# Patient Record
Sex: Male | Born: 1985 | Race: White | Hispanic: No | Marital: Single | Smoking: Current every day smoker
Health system: Southern US, Community
[De-identification: ages and names within clinical notes are randomized; demographics above are authoritative.]

## PROBLEM LIST (undated history)

## (undated) DIAGNOSIS — K8020 Calculus of gallbladder without cholecystitis without obstruction: Secondary | ICD-10-CM

## (undated) DIAGNOSIS — H209 Unspecified iridocyclitis: Secondary | ICD-10-CM

## (undated) DIAGNOSIS — R51 Headache: Secondary | ICD-10-CM

## (undated) DIAGNOSIS — G40909 Epilepsy, unspecified, not intractable, without status epilepticus: Secondary | ICD-10-CM

## (undated) DIAGNOSIS — M300 Polyarteritis nodosa: Secondary | ICD-10-CM

## (undated) DIAGNOSIS — R519 Headache, unspecified: Secondary | ICD-10-CM

## (undated) HISTORY — DX: Epilepsy, unspecified, not intractable, without status epilepticus: G40.909

## (undated) HISTORY — DX: Polyarteritis nodosa: M30.0

## (undated) HISTORY — PX: KNEE SURGERY: SHX244

## (undated) HISTORY — PX: OTHER SURGICAL HISTORY: SHX169

## (undated) HISTORY — DX: Unspecified iridocyclitis: H20.9

## (undated) HISTORY — PX: BIOPSY EYE MUSCLE: PRO14

---
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
## 2003-03-30 ENCOUNTER — Encounter: Payer: Self-pay | Admitting: Emergency Medicine

## 2003-03-30 ENCOUNTER — Emergency Department (HOSPITAL_COMMUNITY): Admission: EM | Admit: 2003-03-30 | Discharge: 2003-03-30 | Payer: Self-pay | Admitting: Emergency Medicine

## 2003-11-19 ENCOUNTER — Emergency Department (HOSPITAL_COMMUNITY): Admission: EM | Admit: 2003-11-19 | Discharge: 2003-11-19 | Payer: Self-pay | Admitting: Emergency Medicine

## 2013-09-16 ENCOUNTER — Ambulatory Visit: Payer: Self-pay | Admitting: Family Medicine

## 2013-09-25 ENCOUNTER — Encounter: Payer: Self-pay | Admitting: Family Medicine

## 2013-09-25 ENCOUNTER — Ambulatory Visit (INDEPENDENT_AMBULATORY_CARE_PROVIDER_SITE_OTHER): Payer: Medicare Other | Admitting: Family Medicine

## 2013-09-25 VITALS — BP 128/72 | HR 78 | Temp 98.5°F | Resp 18 | Ht 75.0 in | Wt 326.0 lb

## 2013-09-25 DIAGNOSIS — M300 Polyarteritis nodosa: Secondary | ICD-10-CM | POA: Insufficient documentation

## 2013-09-25 DIAGNOSIS — H209 Unspecified iridocyclitis: Secondary | ICD-10-CM | POA: Insufficient documentation

## 2013-09-25 DIAGNOSIS — G40909 Epilepsy, unspecified, not intractable, without status epilepticus: Secondary | ICD-10-CM | POA: Insufficient documentation

## 2013-09-25 DIAGNOSIS — J209 Acute bronchitis, unspecified: Secondary | ICD-10-CM

## 2013-09-25 MED ORDER — HYDROCODONE-HOMATROPINE 5-1.5 MG/5ML PO SYRP: 5.0000 mL | ORAL_SOLUTION | Freq: Three times a day (TID) | ORAL | Status: DC | PRN

## 2013-09-25 MED ORDER — AZITHROMYCIN 250 MG PO TABS: ORAL_TABLET | ORAL | Status: DC

## 2013-09-25 NOTE — Progress Notes (Signed)
   Subjective:    Patient ID: Lisbeth Renshaw, male    DOB: 1986/05/14, 27 y.o.   MRN: [id number]  HPI Patient is a very pleasant 27 year old white male who has a history of autoimmune disorder. He is currently seen at [facility] and is receiving Cytoxan. The actual diagnosis is unclear although he is presumptively being treated for polyarteritis nodosa.  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 3 months ago he developed generalized tonic-clonic seizures. This is being worked up at [facility] as well and is on Topamax for seizure disorder. Approximately 3 weeks ago he developed a nonproductive cough. He reports subjective fevers and chills. He reports some mild shortness of breath and central pleurisy.  He denies any hemoptysis. Past Medical History  Diagnosis Date  . PAN (polyarteritis nodosa)   . Uveitis   . Seizure disorder    No current outpatient prescriptions on file prior to visit.   No current facility-administered medications on file prior to visit.   Allergies  Allergen Reactions  . Shellfish Allergy    History   Social History  . Marital Status: Single    Spouse Name: N/A    Number of Children: N/A  . Years of Education: N/A   Occupational History  . Not on file.   Social History Main Topics  . Smoking status: Current Every Day Smoker  . Smokeless tobacco: Not on file  . Alcohol Use: Yes     Comment: occasional  . Drug Use: No  . Sexual Activity: No   Other Topics Concern  . Not on file   Social History Narrative  . No narrative on file      Review of Systems  All other systems reviewed and are negative.       Objective:   Physical Exam  Constitutional: He appears well-developed and well-nourished.  HENT:  Right Ear: External ear normal.  Left Ear: External ear normal.  Nose: Nose normal.  Mouth/Throat: Oropharynx is clear and moist.  Eyes: Conjunctivae are normal. No scleral icterus.  Neck: Neck supple.  Cardiovascular: Normal rate, regular rhythm,  normal heart sounds and intact distal pulses.  Exam reveals no gallop and no friction rub.   No murmur heard. Pulmonary/Chest: Effort normal and breath sounds normal. No respiratory distress. He has no wheezes. He has no rales.  Abdominal: Soft. Bowel sounds are normal.  Lymphadenopathy:    He has no cervical adenopathy.          Assessment & Plan:  1. Acute bronchitis Begin Z-Pak. Recheck in 1 week if no better or sooner if worse. If symptoms persist I would proceed with chest x-ray. The lesion in his navel appears to be a small barrel that has cleared up on a time. It requires no further attention at present - azithromycin (ZITHROMAX) 250 MG tablet; 2 tabs poqday 1, 1 tab poqday 2-5  Dispense: 6 tablet; Refill: 0

## 2015-01-06 ENCOUNTER — Inpatient Hospital Stay (HOSPITAL_COMMUNITY)
Admission: EM | Admit: 2015-01-06 | Discharge: 2015-01-08 | DRG: 418 | Disposition: A | Discharge status: Home / Self Care | Payer: Medicare Other | Attending: General Surgery | Admitting: General Surgery

## 2015-01-06 ENCOUNTER — Other Ambulatory Visit (HOSPITAL_COMMUNITY): Payer: Self-pay

## 2015-01-06 ENCOUNTER — Encounter (HOSPITAL_COMMUNITY): Payer: Self-pay | Admitting: *Deleted

## 2015-01-06 ENCOUNTER — Emergency Department (HOSPITAL_COMMUNITY): Payer: Medicare Other

## 2015-01-06 DIAGNOSIS — G40909 Epilepsy, unspecified, not intractable, without status epilepticus: Secondary | ICD-10-CM | POA: Diagnosis present

## 2015-01-06 DIAGNOSIS — Z7952 Long term (current) use of systemic steroids: Secondary | ICD-10-CM

## 2015-01-06 DIAGNOSIS — Z419 Encounter for procedure for purposes other than remedying health state, unspecified: Secondary | ICD-10-CM

## 2015-01-06 DIAGNOSIS — F17210 Nicotine dependence, cigarettes, uncomplicated: Secondary | ICD-10-CM | POA: Diagnosis present

## 2015-01-06 DIAGNOSIS — T380X5A Adverse effect of glucocorticoids and synthetic analogues, initial encounter: Secondary | ICD-10-CM | POA: Diagnosis present

## 2015-01-06 DIAGNOSIS — R1013 Epigastric pain: Secondary | ICD-10-CM | POA: Diagnosis present

## 2015-01-06 DIAGNOSIS — Z9221 Personal history of antineoplastic chemotherapy: Secondary | ICD-10-CM | POA: Diagnosis not present

## 2015-01-06 DIAGNOSIS — Z91013 Allergy to seafood: Secondary | ICD-10-CM | POA: Diagnosis not present

## 2015-01-06 DIAGNOSIS — K8000 Calculus of gallbladder with acute cholecystitis without obstruction: Secondary | ICD-10-CM

## 2015-01-06 DIAGNOSIS — M300 Polyarteritis nodosa: Secondary | ICD-10-CM | POA: Diagnosis present

## 2015-01-06 DIAGNOSIS — K8020 Calculus of gallbladder without cholecystitis without obstruction: Secondary | ICD-10-CM | POA: Diagnosis present

## 2015-01-06 HISTORY — DX: Calculus of gallbladder without cholecystitis without obstruction: K80.20

## 2015-01-06 HISTORY — DX: Headache, unspecified: R51.9

## 2015-01-06 HISTORY — DX: Headache: R51

## 2015-01-06 LAB — COMPREHENSIVE METABOLIC PANEL
ALT: 37 U/L (ref 0–53)
AST: 25 U/L (ref 0–37)
Albumin: 3.7 g/dL (ref 3.5–5.2)
Alkaline Phosphatase: 81 U/L (ref 39–117)
Anion gap: 6 (ref 5–15)
BUN: 14 mg/dL (ref 6–23)
CO2: 27 mmol/L (ref 19–32)
CREATININE: 0.81 mg/dL (ref 0.50–1.35)
Calcium: 9 mg/dL (ref 8.4–10.5)
Chloride: 106 mmol/L (ref 96–112)
GFR calc Af Amer: >90 mL/min (ref >90)
GFR calc non Af Amer: >90 mL/min (ref >90)
Glucose, Bld: 118 mg/dL — ABNORMAL HIGH (ref 70–99)
Potassium: 4.2 mmol/L (ref 3.5–5.1)
SODIUM: 139 mmol/L (ref 135–145)
Total Bilirubin: 0.4 mg/dL (ref 0.3–1.2)
Total Protein: 7.1 g/dL (ref 6.0–8.3)

## 2015-01-06 LAB — LIPASE, BLOOD: LIPASE: 23 U/L (ref 11–59)

## 2015-01-06 LAB — CBC WITH DIFFERENTIAL/PLATELET
BASOS ABS: 0 10*3/uL (ref 0.0–0.1)
Basophils Relative: 0 % (ref 0–1)
EOS PCT: 1 % (ref 0–5)
Eosinophils Absolute: 0.1 10*3/uL (ref 0.0–0.7)
HEMATOCRIT: 44.2 % (ref 39.0–52.0)
Hemoglobin: 15.4 g/dL (ref 13.0–17.0)
LYMPHS ABS: 1.2 10*3/uL (ref 0.7–4.0)
Lymphocytes Relative: 7 % — ABNORMAL LOW (ref 12–46)
MCH: 32.1 pg (ref 26.0–34.0)
MCHC: 34.8 g/dL (ref 30.0–36.0)
MCV: 92.1 fL (ref 78.0–100.0)
Monocytes Absolute: 0.6 10*3/uL (ref 0.1–1.0)
Monocytes Relative: 3 % (ref 3–12)
NEUTROS ABS: 14.8 10*3/uL — AB (ref 1.7–7.7)
NEUTROS PCT: 89 % — AB (ref 43–77)
Platelets: 312 10*3/uL (ref 150–400)
RBC: 4.8 MIL/uL (ref 4.22–5.81)
RDW: 12.6 % (ref 11.5–15.5)
WBC: 16.7 10*3/uL — ABNORMAL HIGH (ref 4.0–10.5)

## 2015-01-06 LAB — URINALYSIS, ROUTINE W REFLEX MICROSCOPIC
BILIRUBIN URINE: NEGATIVE
Glucose, UA: NEGATIVE mg/dL
Hgb urine dipstick: NEGATIVE
KETONES UR: NEGATIVE mg/dL
Leukocytes, UA: NEGATIVE
Nitrite: NEGATIVE
PH: 5 (ref 5.0–8.0)
Protein, ur: NEGATIVE mg/dL
Specific Gravity, Urine: 1.02 (ref 1.005–1.030)
UROBILINOGEN UA: 0.2 mg/dL (ref 0.0–1.0)

## 2015-01-06 MED ORDER — ACETAMINOPHEN 325 MG PO TABS
650.0000 mg | ORAL_TABLET | Freq: Four times a day (QID) | ORAL | Status: DC | PRN
  Administered 2015-01-08: 650 mg via ORAL
  Filled 2015-01-06: qty 2

## 2015-01-06 MED ORDER — MORPHINE SULFATE 4 MG/ML IJ SOLN
6.0000 mg | Freq: Once | INTRAMUSCULAR | Status: AC
  Administered 2015-01-06: 6 mg via INTRAVENOUS
  Filled 2015-01-06: qty 2

## 2015-01-06 MED ORDER — HYDROMORPHONE HCL 1 MG/ML IJ SOLN
1.0000 mg | Freq: Once | INTRAMUSCULAR | Status: AC
  Administered 2015-01-06: 1 mg via INTRAVENOUS
  Filled 2015-01-06: qty 1

## 2015-01-06 MED ORDER — MORPHINE SULFATE 4 MG/ML IJ SOLN
6.0000 mg | Freq: Once | INTRAMUSCULAR | Status: AC
Start: 2015-01-06 — End: 2015-01-06
  Administered 2015-01-06: 6 mg via INTRAVENOUS
  Filled 2015-01-06: qty 2

## 2015-01-06 MED ORDER — ONDANSETRON HCL 4 MG/2ML IJ SOLN
4.0000 mg | Freq: Once | INTRAMUSCULAR | Status: AC
  Administered 2015-01-06: 4 mg via INTRAVENOUS
  Filled 2015-01-06: qty 2

## 2015-01-06 MED ORDER — LACTATED RINGERS IV SOLN
INTRAVENOUS | Status: DC
  Administered 2015-01-06 – 2015-01-08 (×3): via INTRAVENOUS

## 2015-01-06 MED ORDER — ENOXAPARIN SODIUM 40 MG/0.4ML ~~LOC~~ SOLN
40.0000 mg | SUBCUTANEOUS | Status: DC
  Filled 2015-01-06: qty 0.4

## 2015-01-06 MED ORDER — NICOTINE 21 MG/24HR TD PT24
21.0000 mg | MEDICATED_PATCH | Freq: Every day | TRANSDERMAL | Status: DC
  Administered 2015-01-06 – 2015-01-08 (×3): 21 mg via TRANSDERMAL
  Filled 2015-01-06 (×3): qty 1

## 2015-01-06 MED ORDER — ACETAMINOPHEN 650 MG RE SUPP: 650.0000 mg | Freq: Four times a day (QID) | RECTAL | Status: DC | PRN

## 2015-01-06 MED ORDER — TRAZODONE HCL 50 MG PO TABS
50.0000 mg | ORAL_TABLET | Freq: Every day | ORAL | Status: DC
  Administered 2015-01-07: 50 mg via ORAL
  Filled 2015-01-06 (×3): qty 1

## 2015-01-06 MED ORDER — OXYCODONE HCL 5 MG PO TABS
5.0000 mg | ORAL_TABLET | ORAL | Status: DC | PRN
  Administered 2015-01-08 (×3): 10 mg via ORAL
  Filled 2015-01-06 (×3): qty 2

## 2015-01-06 MED ORDER — DEXTROSE 5 % IV SOLN
2.0000 g | INTRAVENOUS | Status: DC
  Administered 2015-01-06 – 2015-01-08 (×3): 2 g via INTRAVENOUS
  Filled 2015-01-06 (×4): qty 2

## 2015-01-06 MED ORDER — ONDANSETRON HCL 4 MG/2ML IJ SOLN
4.0000 mg | Freq: Four times a day (QID) | INTRAMUSCULAR | Status: DC | PRN
  Administered 2015-01-06: 4 mg via INTRAVENOUS
  Filled 2015-01-06: qty 2

## 2015-01-06 MED ORDER — GI COCKTAIL ~~LOC~~
30.0000 mL | Freq: Once | ORAL | Status: AC
  Administered 2015-01-06: 30 mL via ORAL
  Filled 2015-01-06: qty 30

## 2015-01-06 MED ORDER — FUROSEMIDE 40 MG PO TABS
40.0000 mg | ORAL_TABLET | Freq: Every day | ORAL | Status: DC
  Administered 2015-01-06 – 2015-01-08 (×3): 40 mg via ORAL
  Filled 2015-01-06 (×2): qty 1
  Filled 2015-01-06: qty 2

## 2015-01-06 MED ORDER — PANTOPRAZOLE SODIUM 40 MG PO TBEC
40.0000 mg | DELAYED_RELEASE_TABLET | Freq: Every day | ORAL | Status: DC
  Administered 2015-01-06 – 2015-01-08 (×3): 40 mg via ORAL
  Filled 2015-01-06 (×3): qty 1

## 2015-01-06 MED ORDER — HYDROMORPHONE HCL 1 MG/ML IJ SOLN
1.0000 mg | INTRAMUSCULAR | Status: DC | PRN
  Administered 2015-01-06 – 2015-01-08 (×7): 1 mg via INTRAVENOUS
  Filled 2015-01-06 (×7): qty 1

## 2015-01-06 NOTE — H&P (Signed)
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  He presents today with worsening epigastric abdominal pain.  Duration of symptoms is 6 months.  Onset was gradual.  Coarse is worsening.  He reports 3-4 episodes per week which usually are aggravated by oral intake.  Last episode started at 1AM last night. He had steak and mashed potatoes for dinner.  Location is epigastric region with radiation bilaterally, worse to RUQ.  Associated with nausea and vomiting.  He denies fever, chills or sweats.  Modifying factors include; PPI, tums, pain medication without any relief.  No aggravating factors.  Characterized as sharp constant pain.  Severe in severity.  He rates his pain 6/10 after 63m of morphine and 261mof dilaudid.  He reports hematochezia which is intermittent and [facility] within the last 2 years, he denies any recent symptoms.  He reports weight gain and loss secondary to prednisone use.   His work up shows, [place] cholelithiasis, white count of 16k, normal LFTs.  We have been asked to evaluate for symptomatic cholelithiasis.    Past Medical History  Diagnosis Date  . PAN (polyarteritis nodosa)   . Uveitis   . Seizure disorder     Past Surgical History  Procedure Laterality Date  . Bone grafts    . Knee surgery    . Biopsy eye muscle      Family History  Problem Relation Age of Onset  . COPD Father   . Hyperlipidemia Father   . Diabetes Father    Social History:  reports that he has been smoking.  He does not have any smokeless tobacco history on file. He reports that he drinks alcohol. He reports that he does not use illicit drugs.  Allergies:  Allergies  Allergen Reactions  . Shellfish Allergy    Prior to Admission  medications   Medication Sig Start Date End Date Taking? Authorizing Provider  b complex vitamins tablet Take 1 tablet by mouth daily.   Yes Historical Provider, MD  ciprofloxacin (CIPRO) 500 MG tablet Take 500 mg by mouth 2 (two) times daily.   Yes Historical Provider, MD  furosemide (LASIX) 40 MG tablet Take 40 mg by mouth daily.   Yes Historical Provider, MD  HYDROcodone-acetaminophen (NORCO/VICODIN) 5-325 MG per tablet Take 1 tablet by mouth every 6 (six) hours as needed for moderate pain.   Yes Historical Provider, MD  indomethacin (INDOCIN) 50 MG capsule Take 50 mg by mouth 2 (two) times daily with a meal.   Yes Historical Provider, MD  Multiple Vitamin (MULTIVITAMIN) tablet Take 1 tablet by mouth daily.   Yes Historical Provider, MD  omeprazole (PRILOSEC) 40 MG capsule Take 40 mg by mouth daily.   Yes Historical Provider, MD  ondansetron (ZOFRAN) 8 MG tablet Take by mouth every 8 (eight) hours as needed for nausea or vomiting.   Yes Historical Provider, MD  predniSONE (DELTASONE) 10 MG tablet Take 10 mg by mouth daily as needed (when immunity affects eyes). As directed for auto immune   Yes Historical Provider, MD  promethazine (PHENERGAN) 25 MG tablet Take 25 mg by mouth every 6 (six) hours as needed for nausea or vomiting.   Yes Historical Provider, MD  sulfamethoxazole-trimethoprim (BACTRIM DS,SEPTRA DS) 800-160 MG per  tablet Take 1 tablet by mouth 2 (two) times daily.   Yes Historical Provider, MD  traZODone (DESYREL) 50 MG tablet 50 mg. 3 tab po QHS   Yes Historical Provider, MD      (Not in a hospital admission)  Results for orders placed or performed during the hospital encounter of 01/06/15 (from the past 48 hour(s))  CBC with Differential/Platelet     Status: Abnormal   Collection Time: 01/06/15  7:00 AM  Result Value Ref Range   WBC 16.7 (H) 4.0 - 10.5 K/uL   RBC 4.80 4.22 - 5.81 MIL/uL   Hemoglobin 15.4 13.0 - 17.0 g/dL   HCT 44.2 39.0 - 52.0 %   MCV 92.1 78.0 - 100.0 fL    MCH 32.1 26.0 - 34.0 pg   MCHC 34.8 30.0 - 36.0 g/dL   RDW 12.6 11.5 - 15.5 %   Platelets 312 150 - 400 K/uL   Neutrophils Relative % 89 (H) 43 - 77 %   Neutro Abs 14.8 (H) 1.7 - 7.7 K/uL   Lymphocytes Relative 7 (L) 12 - 46 %   Lymphs Abs 1.2 0.7 - 4.0 K/uL   Monocytes Relative 3 3 - 12 %   Monocytes Absolute 0.6 0.1 - 1.0 K/uL   Eosinophils Relative 1 0 - 5 %   Eosinophils Absolute 0.1 0.0 - 0.7 K/uL   Basophils Relative 0 0 - 1 %   Basophils Absolute 0.0 0.0 - 0.1 K/uL  Comprehensive metabolic panel     Status: Abnormal   Collection Time: 01/06/15  7:00 AM  Result Value Ref Range   Sodium 139 135 - 145 mmol/L   Potassium 4.2 3.5 - 5.1 mmol/L   Chloride 106 96 - 112 mmol/L   CO2 27 19 - 32 mmol/L   Glucose, Bld 118 (H) 70 - 99 mg/dL   BUN 14 6 - 23 mg/dL   Creatinine, Ser 0.81 0.50 - 1.35 mg/dL   Calcium 9.0 8.4 - 10.5 mg/dL   Total Protein 7.1 6.0 - 8.3 g/dL   Albumin 3.7 3.5 - 5.2 g/dL   AST 25 0 - 37 U/L   ALT 37 0 - 53 U/L   Alkaline Phosphatase 81 39 - 117 U/L   Total Bilirubin 0.4 0.3 - 1.2 mg/dL   GFR calc non Af Amer >90 >90 mL/min   GFR calc Af Amer >90 >90 mL/min    Comment: (NOTE) The eGFR has been calculated using the CKD EPI equation. This calculation has not been validated in all clinical situations. eGFR's persistently <90 mL/min signify possible Chronic Kidney Disease.    Anion gap 6 5 - 15  Lipase, blood     Status: None   Collection Time: 01/06/15  7:00 AM  Result Value Ref Range   Lipase 23 11 - 59 U/L   US Abdomen Limited  01/06/2015   CLINICAL DATA:  Epigastric pain.  EXAM: US ABDOMEN LIMITED - RIGHT UPPER QUADRANT  COMPARISON:  None.  FINDINGS: Gallbladder:  Multiple low ball stones are identified gallbladder wall measures 1.8 mm in thickness. No para cholecystic fluid. Negative sonographic Murphy's sign.  Common bile duct:  Diameter: 7.5 mm  Liver:  No focal lesion identified. Within normal limits in parenchymal echogenicity.  IMPRESSION: 1.  Gallstones.  No secondary signs of acute cholecystitis. 2. Mild increase caliber of the common bile duct.   Electronically Signed   By: Kerby Moors M.D.   On: 01/06/2015 10:35    Review  of Systems  All other systems reviewed and are negative.   Blood pressure 103/55, pulse 73, temperature 97.5 F (36.4 C), temperature source Oral, resp. rate 18, SpO2 94 %. Physical Exam  Constitutional: He is oriented to person, place, and time. He appears well-nourished. No distress.  Eyes: Right eye exhibits no discharge. Left eye exhibits no discharge.  Bilateral injection  Cardiovascular: Normal rate, regular rhythm, normal heart sounds and intact distal pulses.  Exam reveals no gallop and no friction rub.   No murmur heard. Respiratory: Effort normal and breath sounds normal. No respiratory distress. He has no wheezes. He has no rales. He exhibits no tenderness.  GI: Soft. Bowel sounds are normal. He exhibits no distension and no mass. There is no tenderness. There is no rebound and no guarding.  Musculoskeletal: Normal range of motion. He exhibits no edema or tenderness.  Neurological: He is alert and oriented to person, place, and time.  Skin: Skin is warm and dry. He is not diaphoretic.  Psychiatric: He has a normal mood and affect. His behavior is normal. Judgment and thought content normal.     Assessment/Plan Symptomatic cholelithiasis -admit for lap chole tomorrow -may have clears, NPO after midnight -IVF -pain control and anti-emetics -SCD/lovenox  -rocephin  -check UA   Khairi Garman ANP-BC 01/06/2015, 12:06 PM

## 2015-01-06 NOTE — Anesthesia Preprocedure Evaluation (Addendum)
Anesthesia Evaluation  Patient identified by MRN, date of birth, ID band Patient awake    Reviewed: Allergy & Precautions, NPO status , Patient's Chart, lab work & pertinent test results  Airway Mallampati: III   Neck ROM: Full    Dental  (+) Teeth Intact, Dental Advisory Given   Pulmonary Current Smoker,  breath sounds clear to auscultation        Cardiovascular Rhythm:Regular  EKG 12/2014 OK   Neuro/Psych Seizures -,     GI/Hepatic   Endo/Other    Renal/GU      Musculoskeletal   Abdominal (+) + obese,   Peds  Hematology   Anesthesia Other Findings Polyarteritis nodosa (takes Prednisone PRN)  Reproductive/Obstetrics                            Anesthesia Physical Anesthesia Plan  ASA: III  Anesthesia Plan: General   Post-op Pain Management:    Induction: Intravenous  Airway Management Planned: Oral ETT  Additional Equipment:   Intra-op Plan:   Post-operative Plan:   Informed Consent: I have reviewed the patients History and Physical, chart, labs and discussed the procedure including the risks, benefits and alternatives for the proposed anesthesia with the patient or authorized representative who has indicated his/her understanding and acceptance.     Plan Discussed with:   Anesthesia Plan Comments: (May need cortisone boost depending on how often he is taking Prednisone)        Anesthesia Quick Evaluation

## 2015-01-06 NOTE — ED Notes (Signed)
General surgery NP at bedside.

## 2015-01-06 NOTE — ED Notes (Addendum)
Pt with acute onset abdominal pain and emesis x 6 since 3 am.  Per EMS, pt is being [facility].  VS stable per EMS.  Pt states he's had episodes like this previously and they are usu r/t food he eats.

## 2015-01-06 NOTE — ED Notes (Signed)
Pt notified of plan of care and diet.

## 2015-01-06 NOTE — ED Provider Notes (Signed)
CSN: [id number]     Arrival date & time 01/06/15  0711 History   First MD Initiated Contact with Patient 01/06/15 [phone]     Chief Complaint  Patient presents with  . Emesis  . Abdominal Pain     (Consider location/radiation/quality/duration/timing/severity/associated sxs/prior Treatment) Patient is a 29 y.o. male presenting with vomiting and abdominal pain. The history is provided by the patient and a relative.  Emesis Severity:  Moderate Duration:  4 hours Timing:  Intermittent Number of daily episodes:  6 Quality:  Stomach contents Progression:  Unchanged Chronicity:  New Recent urination:  Normal Relieved by:  Nothing Associated symptoms: abdominal pain   Associated symptoms: no cough, no diarrhea, no fever and no headaches   Abdominal pain:    Location:  Epigastric   Quality:  Sharp   Severity:  Severe   Onset quality:  Sudden   Duration:  6 hours   Timing:  Constant   Progression:  Unchanged   Chronicity:  Recurrent Risk factors comment:  Sometimes triggered by food Abdominal Pain Associated symptoms: vomiting   Associated symptoms: no chest pain, no constipation, no cough, no diarrhea, no dysuria, no fatigue, no fever, no nausea and no shortness of breath     Past Medical History  Diagnosis Date  . PAN (polyarteritis nodosa)   . Uveitis   . Seizure disorder   . Cholelithiasis   . Headache    Past Surgical History  Procedure Laterality Date  . Bone grafts    . Knee surgery    . Biopsy eye muscle     Family History  Problem Relation Age of Onset  . COPD Father   . Hyperlipidemia Father   . Diabetes Father    History  Substance Use Topics  . Smoking status: Current Every Day Smoker -- 1.00 packs/day for 10 years    Types: Cigarettes  . Smokeless tobacco: Never Used  . Alcohol Use: Yes     Comment: occasional    Review of Systems  Constitutional: Negative for fever, activity change, appetite change and fatigue.  HENT: Negative for congestion,  facial swelling, rhinorrhea and trouble swallowing.   Eyes: Negative for photophobia and pain.  Respiratory: Negative for cough, chest tightness and shortness of breath.   Cardiovascular: Negative for chest pain and leg swelling.  Gastrointestinal: Positive for vomiting and abdominal pain. Negative for nausea, diarrhea and constipation.  Endocrine: Negative for polydipsia and polyuria.  Genitourinary: Negative for dysuria, urgency, decreased urine volume and difficulty urinating.  Musculoskeletal: Negative for back pain and gait problem.  Skin: Negative for color change, rash and wound.  Allergic/Immunologic: Negative for immunocompromised state.  Neurological: Negative for dizziness, facial asymmetry, speech difficulty, weakness, numbness and headaches.  Psychiatric/Behavioral: Negative for confusion, decreased concentration and agitation.      Allergies  Shellfish allergy  Home Medications   Prior to Admission medications   Medication Sig Start Date End Date Taking? Authorizing Provider  b complex vitamins tablet Take 1 tablet by mouth daily.   Yes Historical Provider, MD  furosemide (LASIX) 40 MG tablet Take 40 mg by mouth daily.   Yes Historical Provider, MD  HYDROcodone-acetaminophen (NORCO/VICODIN) 5-325 MG per tablet Take 1 tablet by mouth every 6 (six) hours as needed for moderate pain.   Yes Historical Provider, MD  indomethacin (INDOCIN) 50 MG capsule Take 50 mg by mouth 2 (two) times daily with a meal.   Yes Historical Provider, MD  Multiple Vitamin (MULTIVITAMIN) tablet Take 1 tablet by  mouth daily.   Yes Historical Provider, MD  omeprazole (PRILOSEC) 40 MG capsule Take 40 mg by mouth daily.   Yes Historical Provider, MD  ondansetron (ZOFRAN) 8 MG tablet Take by mouth every 8 (eight) hours as needed for nausea or vomiting.   Yes Historical Provider, MD  predniSONE (DELTASONE) 10 MG tablet Take 10 mg by mouth daily as needed (when immunity affects eyes). As directed for auto  immune   Yes Historical Provider, MD  promethazine (PHENERGAN) 25 MG tablet Take 25 mg by mouth every 6 (six) hours as needed for nausea or vomiting.   Yes Historical Provider, MD  traZODone (DESYREL) 50 MG tablet 50 mg. 3 tab po QHS   Yes Historical Provider, MD   BP 121/66 mmHg  Pulse 80  Temp(Src) 97.7 F (36.5 C) (Oral)  Resp 18  Ht  (1.905 m)  Wt 317 lb (143.79 kg)  BMI 39.62 kg/m2  SpO2 97% Physical Exam  Constitutional: He is oriented to person, place, and time. He appears well-developed and well-nourished. No distress.  HENT:  Head: Normocephalic and atraumatic.  Mouth/Throat: No oropharyngeal exudate.  Eyes: Pupils are equal, round, and reactive to light.  Neck: Normal range of motion. Neck supple.  Cardiovascular: Normal rate, regular rhythm and normal heart sounds.  Exam reveals no gallop and no friction rub.   No murmur heard. Pulmonary/Chest: Effort normal and breath sounds normal. No respiratory distress. He has no wheezes. He has no rales.  Abdominal: Soft. Bowel sounds are normal. He exhibits no distension and no mass. There is tenderness in the epigastric area. There is no rebound and no guarding.  Musculoskeletal: Normal range of motion. He exhibits no edema or tenderness.  Neurological: He is alert and oriented to person, place, and time.  Skin: Skin is warm and dry.  Psychiatric: He has a normal mood and affect.    ED Course  Procedures (including critical care time) Labs Review Labs Reviewed  CBC WITH DIFFERENTIAL/PLATELET - Abnormal; Notable for the following:    WBC 16.7 (*)    Neutrophils Relative % 89 (*)    Neutro Abs 14.8 (*)    Lymphocytes Relative 7 (*)    All other components within normal limits  COMPREHENSIVE METABOLIC PANEL - Abnormal; Notable for the following:    Glucose, Bld 118 (*)    All other components within normal limits  SURGICAL PCR SCREEN  LIPASE, BLOOD  URINALYSIS, ROUTINE W REFLEX MICROSCOPIC    Imaging Review US  Abdomen Limited  01/06/2015   CLINICAL DATA:  Epigastric pain.  EXAM: US ABDOMEN LIMITED - RIGHT UPPER QUADRANT  COMPARISON:  None.  FINDINGS: Gallbladder:  Multiple low ball stones are identified gallbladder wall measures 1.8 mm in thickness. No para cholecystic fluid. Negative sonographic Murphy's sign.  Common bile duct:  Diameter: 7.5 mm  Liver:  No focal lesion identified. Within normal limits in parenchymal echogenicity.  IMPRESSION: 1. Gallstones.  No secondary signs of acute cholecystitis. 2. Mild increase caliber of the common bile duct.   Electronically Signed   By: Signa Kell M.D.   On: 01/06/2015 10:35     EKG Interpretation None      MDM   Final diagnoses:  Epigastric pain  Symptomatic cholelithiasis    Pt is a 29 y.o. male with Pmhx as above who presents with about 6 hrs of epigastric pain and bout 3 hrs of nonbloody, nonbilious emesis. He has been having similar pains intermittently for about 6 months,  but usu pain lasts about 20 mins. On PE, VSS, pt in NAD> +epgastric ttp w/o rebound or guarding. He has been on abx recently for dental infxn, no travel or suspicious food intake.   Labs show leukocytosis, nml lipase, LFTS. Pain continued after 6mg  morphine x2, GI cocktail. US ordered.   US shows cholelithiasis w/o cystitis and mild CBD dilation. Pain poorly controlled despite 2mg  IV dilaudid. On repeat exma, pain is worst in epigastrium and RUQ. CCS consulted for concern for symptomatic cholelithiasis and will admit.    Toy CookeyMegan Docherty, MD 01/06/15 2050

## 2015-01-07 ENCOUNTER — Inpatient Hospital Stay (HOSPITAL_COMMUNITY): Payer: Medicare Other | Admitting: Anesthesiology

## 2015-01-07 ENCOUNTER — Encounter (HOSPITAL_COMMUNITY): Admission: EM | Disposition: A | Discharge status: Home / Self Care | Payer: Self-pay | Source: Home / Self Care

## 2015-01-07 ENCOUNTER — Inpatient Hospital Stay (HOSPITAL_COMMUNITY): Payer: Medicare Other

## 2015-01-07 ENCOUNTER — Encounter (HOSPITAL_COMMUNITY): Payer: Self-pay | Admitting: Anesthesiology

## 2015-01-07 HISTORY — PX: CHOLECYSTECTOMY: SHX55

## 2015-01-07 LAB — SURGICAL PCR SCREEN
MRSA, PCR: POSITIVE — AB
Staphylococcus aureus: POSITIVE — AB

## 2015-01-07 SURGERY — LAPAROSCOPIC CHOLECYSTECTOMY WITH INTRAOPERATIVE CHOLANGIOGRAM
Anesthesia: General | Site: Abdomen

## 2015-01-07 MED ORDER — CHLORHEXIDINE GLUCONATE CLOTH 2 % EX PADS
6.0000 | MEDICATED_PAD | Freq: Every day | CUTANEOUS | Status: DC
  Administered 2015-01-07 – 2015-01-08 (×2): 6 via TOPICAL

## 2015-01-07 MED ORDER — FENTANYL CITRATE 0.05 MG/ML IJ SOLN: 25.0000 ug | INTRAMUSCULAR | Status: DC | PRN

## 2015-01-07 MED ORDER — FENTANYL CITRATE 0.05 MG/ML IJ SOLN
INTRAMUSCULAR | Status: DC | PRN
  Administered 2015-01-07: 100 ug via INTRAVENOUS
  Administered 2015-01-07 (×2): 150 ug via INTRAVENOUS
  Administered 2015-01-07: 100 ug via INTRAVENOUS

## 2015-01-07 MED ORDER — DEXMEDETOMIDINE HCL IN NACL 200 MCG/50ML IV SOLN
INTRAVENOUS | Status: AC
  Filled 2015-01-07: qty 50

## 2015-01-07 MED ORDER — MIDAZOLAM HCL 2 MG/2ML IJ SOLN
INTRAMUSCULAR | Status: AC
  Filled 2015-01-07: qty 2

## 2015-01-07 MED ORDER — ROCURONIUM BROMIDE 100 MG/10ML IV SOLN
INTRAVENOUS | Status: DC | PRN
  Administered 2015-01-07: 70 mg via INTRAVENOUS
  Administered 2015-01-07: 10 mg via INTRAVENOUS

## 2015-01-07 MED ORDER — LACTATED RINGERS IV SOLN
INTRAVENOUS | Status: DC | PRN
Start: 2015-01-07 — End: 2015-01-07
  Administered 2015-01-07 (×3): via INTRAVENOUS

## 2015-01-07 MED ORDER — ONDANSETRON HCL 4 MG/2ML IJ SOLN
INTRAMUSCULAR | Status: AC
  Filled 2015-01-07: qty 4

## 2015-01-07 MED ORDER — LIDOCAINE HCL (CARDIAC) 20 MG/ML IV SOLN
INTRAVENOUS | Status: AC
  Filled 2015-01-07: qty 15

## 2015-01-07 MED ORDER — SODIUM CHLORIDE 0.9 % IJ SOLN
INTRAMUSCULAR | Status: AC
  Filled 2015-01-07: qty 10

## 2015-01-07 MED ORDER — LACTATED RINGERS IV SOLN
Freq: Once | INTRAVENOUS | Status: AC
  Administered 2015-01-07: 11:00:00 via INTRAVENOUS

## 2015-01-07 MED ORDER — MUPIROCIN 2 % EX OINT
1.0000 "application " | TOPICAL_OINTMENT | Freq: Two times a day (BID) | CUTANEOUS | Status: DC
  Administered 2015-01-07 – 2015-01-08 (×4): 1 via NASAL
  Filled 2015-01-07: qty 22

## 2015-01-07 MED ORDER — GLYCOPYRROLATE 0.2 MG/ML IJ SOLN
INTRAMUSCULAR | Status: DC | PRN
  Administered 2015-01-07: 0.6 mg via INTRAVENOUS

## 2015-01-07 MED ORDER — ONDANSETRON HCL 4 MG/2ML IJ SOLN
INTRAMUSCULAR | Status: DC | PRN
  Administered 2015-01-07 (×2): 4 mg via INTRAVENOUS

## 2015-01-07 MED ORDER — MEPERIDINE HCL 25 MG/ML IJ SOLN: 6.2500 mg | INTRAMUSCULAR | Status: DC | PRN

## 2015-01-07 MED ORDER — PROMETHAZINE HCL 25 MG/ML IJ SOLN
INTRAMUSCULAR | Status: AC
  Filled 2015-01-07: qty 1

## 2015-01-07 MED ORDER — METOCLOPRAMIDE HCL 5 MG/ML IJ SOLN
INTRAMUSCULAR | Status: DC | PRN
  Administered 2015-01-07: 10 mg via INTRAVENOUS

## 2015-01-07 MED ORDER — METOCLOPRAMIDE HCL 5 MG/ML IJ SOLN
INTRAMUSCULAR | Status: AC
  Filled 2015-01-07: qty 2

## 2015-01-07 MED ORDER — MIDAZOLAM HCL 5 MG/5ML IJ SOLN
INTRAMUSCULAR | Status: DC | PRN
  Administered 2015-01-07: 2 mg via INTRAVENOUS

## 2015-01-07 MED ORDER — GLYCOPYRROLATE 0.2 MG/ML IJ SOLN
INTRAMUSCULAR | Status: AC
  Filled 2015-01-07: qty 6

## 2015-01-07 MED ORDER — ROCURONIUM BROMIDE 50 MG/5ML IV SOLN
INTRAVENOUS | Status: AC
  Filled 2015-01-07: qty 2

## 2015-01-07 MED ORDER — FENTANYL CITRATE 0.05 MG/ML IJ SOLN
INTRAMUSCULAR | Status: AC
  Filled 2015-01-07: qty 5

## 2015-01-07 MED ORDER — CEFAZOLIN SODIUM-DEXTROSE 2-3 GM-% IV SOLR
2.0000 g | INTRAVENOUS | Status: DC
  Filled 2015-01-07: qty 50

## 2015-01-07 MED ORDER — PROPOFOL 10 MG/ML IV BOLUS
INTRAVENOUS | Status: AC
  Filled 2015-01-07: qty 20

## 2015-01-07 MED ORDER — ONDANSETRON HCL 4 MG/2ML IJ SOLN
INTRAMUSCULAR | Status: AC
  Filled 2015-01-07: qty 2

## 2015-01-07 MED ORDER — BUPIVACAINE-EPINEPHRINE 0.25% -1:200000 IJ SOLN
INTRAMUSCULAR | Status: DC | PRN
  Administered 2015-01-07: 20 mL

## 2015-01-07 MED ORDER — ROCURONIUM BROMIDE 50 MG/5ML IV SOLN
INTRAVENOUS | Status: AC
  Filled 2015-01-07: qty 1

## 2015-01-07 MED ORDER — PROPOFOL 10 MG/ML IV BOLUS
INTRAVENOUS | Status: DC | PRN
  Administered 2015-01-07: 225 mg via INTRAVENOUS
  Administered 2015-01-07: 25 mg via INTRAVENOUS

## 2015-01-07 MED ORDER — CEFAZOLIN SODIUM-DEXTROSE 2-3 GM-% IV SOLR
INTRAVENOUS | Status: AC
  Filled 2015-01-07: qty 50

## 2015-01-07 MED ORDER — SUCCINYLCHOLINE CHLORIDE 20 MG/ML IJ SOLN
INTRAMUSCULAR | Status: AC
  Filled 2015-01-07: qty 1

## 2015-01-07 MED ORDER — ARTIFICIAL TEARS OP OINT
TOPICAL_OINTMENT | OPHTHALMIC | Status: AC
  Filled 2015-01-07: qty 3.5

## 2015-01-07 MED ORDER — NEOSTIGMINE METHYLSULFATE 10 MG/10ML IV SOLN
INTRAVENOUS | Status: DC | PRN
  Administered 2015-01-07: 5 mg via INTRAVENOUS

## 2015-01-07 MED ORDER — 0.9 % SODIUM CHLORIDE (POUR BTL) OPTIME
TOPICAL | Status: DC | PRN
  Administered 2015-01-07: 1000 mL

## 2015-01-07 MED ORDER — BUPIVACAINE-EPINEPHRINE (PF) 0.25% -1:200000 IJ SOLN
INTRAMUSCULAR | Status: AC
  Filled 2015-01-07: qty 30

## 2015-01-07 MED ORDER — PROMETHAZINE HCL 25 MG/ML IJ SOLN
6.2500 mg | INTRAMUSCULAR | Status: DC | PRN
Start: 2015-01-07 — End: 2015-01-07
  Administered 2015-01-07: 12.5 mg via INTRAVENOUS

## 2015-01-07 MED ORDER — SODIUM CHLORIDE 0.9 % IR SOLN
Status: DC | PRN
  Administered 2015-01-07: 1000 mL

## 2015-01-07 MED ORDER — LIDOCAINE HCL (CARDIAC) 20 MG/ML IV SOLN
INTRAVENOUS | Status: DC | PRN
  Administered 2015-01-07 (×2): 100 mg via INTRAVENOUS

## 2015-01-07 MED ORDER — DEXMEDETOMIDINE HCL 200 MCG/2ML IV SOLN
INTRAVENOUS | Status: DC | PRN
  Administered 2015-01-07: 8 ug via INTRAVENOUS
  Administered 2015-01-07 (×3): 20 ug via INTRAVENOUS

## 2015-01-07 MED ORDER — SODIUM CHLORIDE 0.9 % IV SOLN
INTRAVENOUS | Status: DC | PRN
  Administered 2015-01-07: 50 mL

## 2015-01-07 MED ORDER — VECURONIUM BROMIDE 10 MG IV SOLR
INTRAVENOUS | Status: AC
  Filled 2015-01-07: qty 10

## 2015-01-07 MED ORDER — ARTIFICIAL TEARS OP OINT
TOPICAL_OINTMENT | OPHTHALMIC | Status: DC | PRN
  Administered 2015-01-07: 1 via OPHTHALMIC

## 2015-01-07 SURGICAL SUPPLY — 48 items
APPLIER CLIP ROT 10 11.4 M/L (STAPLE) ×3
BENZOIN TINCTURE PRP APPL 2/3 (GAUZE/BANDAGES/DRESSINGS) ×3 IMPLANT
BLADE SURG ROTATE 9660 (MISCELLANEOUS) ×3 IMPLANT
CANISTER SUCTION 2500CC (MISCELLANEOUS) ×3 IMPLANT
CHLORAPREP W/TINT 26ML (MISCELLANEOUS) ×3 IMPLANT
CLIP APPLIE ROT 10 11.4 M/L (STAPLE) ×1 IMPLANT
CLOSURE STERI-STRIP 1/2X4 (GAUZE/BANDAGES/DRESSINGS) ×1
CLSR STERI-STRIP ANTIMIC 1/2X4 (GAUZE/BANDAGES/DRESSINGS) ×2 IMPLANT
COVER MAYO STAND STRL (DRAPES) ×3 IMPLANT
COVER SURGICAL LIGHT HANDLE (MISCELLANEOUS) ×3 IMPLANT
DRAPE C-ARM 42X72 X-RAY (DRAPES) ×3 IMPLANT
DRAPE LAPAROSCOPIC ABDOMINAL (DRAPES) ×3 IMPLANT
DRSG TEGADERM 2-3/8X2-3/4 SM (GAUZE/BANDAGES/DRESSINGS) ×3 IMPLANT
DRSG TEGADERM 4X4.75 (GAUZE/BANDAGES/DRESSINGS) ×3 IMPLANT
ELECT REM PT RETURN 9FT ADLT (ELECTROSURGICAL) ×3
ELECTRODE REM PT RTRN 9FT ADLT (ELECTROSURGICAL) ×1 IMPLANT
FILTER SMOKE EVAC LAPAROSHD (FILTER) ×3 IMPLANT
GAUZE SPONGE 2X2 8PLY STRL LF (GAUZE/BANDAGES/DRESSINGS) ×1 IMPLANT
GLOVE BIO SURGEON STRL SZ 6.5 (GLOVE) ×2 IMPLANT
GLOVE BIO SURGEON STRL SZ7 (GLOVE) ×3 IMPLANT
GLOVE BIO SURGEONS STRL SZ 6.5 (GLOVE) ×1
GLOVE BIOGEL PI IND STRL 6.5 (GLOVE) ×1 IMPLANT
GLOVE BIOGEL PI IND STRL 7.0 (GLOVE) ×2 IMPLANT
GLOVE BIOGEL PI IND STRL 7.5 (GLOVE) ×1 IMPLANT
GLOVE BIOGEL PI INDICATOR 6.5 (GLOVE) ×2
GLOVE BIOGEL PI INDICATOR 7.0 (GLOVE) ×4
GLOVE BIOGEL PI INDICATOR 7.5 (GLOVE) ×2
GLOVE SURG SS PI 7.0 STRL IVOR (GLOVE) ×3 IMPLANT
GOWN STRL REUS W/ TWL LRG LVL3 (GOWN DISPOSABLE) ×5 IMPLANT
GOWN STRL REUS W/TWL LRG LVL3 (GOWN DISPOSABLE) ×10
KIT BASIN OR (CUSTOM PROCEDURE TRAY) ×3 IMPLANT
KIT ROOM TURNOVER OR (KITS) ×3 IMPLANT
NS IRRIG 1000ML POUR BTL (IV SOLUTION) ×3 IMPLANT
PAD ARMBOARD 7.5X6 YLW CONV (MISCELLANEOUS) ×3 IMPLANT
POUCH SPECIMEN RETRIEVAL 10MM (ENDOMECHANICALS) ×3 IMPLANT
SCISSORS LAP 5X35 DISP (ENDOMECHANICALS) ×3 IMPLANT
SET CHOLANGIOGRAPH 5 50 .035 (SET/KITS/TRAYS/PACK) ×3 IMPLANT
SET IRRIG TUBING LAPAROSCOPIC (IRRIGATION / IRRIGATOR) ×3 IMPLANT
SLEEVE ENDOPATH XCEL 5M (ENDOMECHANICALS) ×3 IMPLANT
SPECIMEN JAR SMALL (MISCELLANEOUS) ×3 IMPLANT
SPONGE GAUZE 2X2 STER 10/PKG (GAUZE/BANDAGES/DRESSINGS) ×2
SUT MNCRL AB 4-0 PS2 18 (SUTURE) ×6 IMPLANT
TOWEL OR 17X26 10 PK STRL BLUE (TOWEL DISPOSABLE) ×3 IMPLANT
TRAY LAPAROSCOPIC (CUSTOM PROCEDURE TRAY) ×3 IMPLANT
TROCAR XCEL BLUNT TIP 100MML (ENDOMECHANICALS) ×3 IMPLANT
TROCAR XCEL NON-BLD 11X100MML (ENDOMECHANICALS) ×3 IMPLANT
TROCAR XCEL NON-BLD 5MMX100MML (ENDOMECHANICALS) ×3 IMPLANT
TUBING INSUFFLATION (TUBING) ×3 IMPLANT

## 2015-01-07 NOTE — Anesthesia Procedure Notes (Signed)
Procedure Name: Intubation Date/Time: 01/07/2015 1:28 PM Performed by: Wray KearnsFOLEY, Kaileah Shevchenko A Pre-anesthesia Checklist: Patient identified, Timeout performed, Emergency Drugs available, Suction available and Patient being monitored Patient Re-evaluated:Patient Re-evaluated prior to inductionOxygen Delivery Method: Circle system utilized Preoxygenation: Pre-oxygenation with 100% oxygen Intubation Type: IV induction, Rapid sequence and Cricoid Pressure applied Laryngoscope Size: Mac and 4 Grade View: Grade I Tube type: Oral Tube size: 8.0 mm Number of attempts: 1 Airway Equipment and Method: Rigid stylet Placement Confirmation: ETT inserted through vocal cords under direct vision,  breath sounds checked- equal and bilateral and positive ETCO2 Secured at: 22 cm Tube secured with: Tape Dental Injury: Teeth and Oropharynx as per pre-operative assessment

## 2015-01-07 NOTE — Transfer of Care (Signed)
Immediate Anesthesia Transfer of Care Note  Patient: Evan RenshawJeremy B Holmes  Procedure(s) Performed: Procedure(s): LAPAROSCOPIC CHOLECYSTECTOMY WITH INTRAOPERATIVE CHOLANGIOGRAM (N/A)  Patient Location: PACU  Anesthesia Type:General  Level of Consciousness: awake  Airway & Oxygen Therapy: Patient Spontanous Breathing and Patient connected to face mask oxygen  Post-op Assessment: Report given to RN and Post -op Vital signs reviewed and stable  Post vital signs: Reviewed and stable  Last Vitals:  Filed Vitals:   01/07/15 1510  BP: 103/70  Pulse: 63  Temp: 36.9 C  Resp: 15    Complications: No apparent anesthesia complications

## 2015-01-07 NOTE — Discharge Instructions (Signed)

## 2015-01-07 NOTE — Op Note (Signed)
Laparoscopic Cholecystectomy with IOC Procedure Note  Indications: This patient presents with symptomatic gallbladder disease and will undergo laparoscopic cholecystectomy.  Pre-operative Diagnosis: Calculus of gallbladder with acute cholecystitis, without mention of obstruction  Post-operative Diagnosis: Same  Surgeon: Kalinda Romaniello K.   Assistants: Ashok Norris, ANP-BC   Anesthesia: General endotracheal anesthesia  ASA Class: 3  Procedure Details  The patient was seen again in the Holding Room. The risks, benefits, complications, treatment options, and expected outcomes were discussed with the patient. The possibilities of reaction to medication, pulmonary aspiration, perforation of viscus, bleeding, recurrent infection, finding a normal gallbladder, the need for additional procedures, failure to diagnose a condition, the possible need to convert to an open procedure, and creating a complication requiring transfusion or operation were discussed with the patient. The likelihood of improving the patient's symptoms with return to their baseline status is good.  The patient and/or family concurred with the proposed plan, giving informed consent. The site of surgery properly noted. The patient was taken to Operating Room, identified as Evan Holmes and the procedure verified as Laparoscopic Cholecystectomy with Intraoperative Cholangiogram. A Time Out was held and the above information confirmed.  Prior to the induction of general anesthesia, antibiotic prophylaxis was administered. General endotracheal anesthesia was then administered and tolerated well. After the induction, the abdomen was prepped with Chloraprep and draped in the sterile fashion. The patient was positioned in the supine position.  Local anesthetic agent was injected into the skin above the umbilicus and an incision made. We dissected down to the abdominal fascia with blunt dissection.  The fascia was incised vertically and  we entered the peritoneal cavity bluntly.  A pursestring suture of 0-Vicryl was placed around the fascial opening.  The Hasson cannula was inserted and secured with the stay suture.  Pneumoperitoneum was then created with CO2 and tolerated well without any adverse changes in the patient's vital signs. An 11-mm port was placed in the subxiphoid position.  Two 5-mm ports were placed in the right upper quadrant. All skin incisions were infiltrated with a local anesthetic agent before making the incision and placing the trocars.   We positioned the patient in reverse Trendelenburg, tilted slightly to the patient's left.  The gallbladder was identified, the fundus grasped and retracted cephalad. The gallbladder was quite thickened and edematous.  Adhesions were lysed bluntly and with the electrocautery where indicated, taking care not to injure any adjacent organs or viscus. The infundibulum was grasped and retracted laterally, exposing the peritoneum overlying the triangle of Calot. This was then divided and exposed in a blunt fashion. A critical view of the cystic duct and cystic artery was obtained.  The cystic duct was clearly identified and bluntly dissected circumferentially. The cystic duct was ligated with a clip distally.   An incision was made in the cystic duct and the [facility] cholangiogram catheter introduced. The catheter was secured using a clip. A cholangiogram was then obtained which showed good visualization of the distal and proximal biliary tree with no sign of filling defects or obstruction.  Contrast flowed easily into the duodenum. The catheter was then removed.   The cystic duct was then ligated with clips and divided. The cystic artery was identified, dissected free, ligated with clips and divided as well.   The gallbladder was dissected from the liver bed in retrograde fashion with the electrocautery. The gallbladder was removed and placed in an Endocatch sac. The liver bed was irrigated and  inspected. Hemostasis was achieved with the  electrocautery. Copious irrigation was utilized and was repeatedly aspirated until clear.  The gallbladder and Endocatch sac were then removed through the umbilical port site.  The pursestring suture was used to close the umbilical fascia.    We again inspected the right upper quadrant for hemostasis.  Pneumoperitoneum was released as we removed the trocars.  4-0 Monocryl was used to close the skin.   Benzoin, steri-strips, and clean dressings were applied. The patient was then extubated and brought to the recovery room in stable condition. Instrument, sponge, and needle counts were correct at closure and at the conclusion of the case.   Findings: Cholecystitis with Cholelithiasis  Estimated Blood Loss: less than 50 mL         Drains: none         Specimens: Gallbladder           Complications: None; patient tolerated the procedure well.         Disposition: PACU - hemodynamically stable.         Condition: stable   Wilmon ArmsMatthew K. Corliss Skainssuei, MD, [facility]  General/ Trauma Surgery  01/07/2015 2:40 PM

## 2015-01-07 NOTE — Progress Notes (Addendum)
[facility] Progress Note  Day of Surgery  Subjective: Pt's pain resolved.  No N/V.  Ready for surgery.  Ambulating well.  Mother at bedside.     Objective: Vital signs in last 24 hours: Temp:  [97.4 F (36.3 C)-98.2 F (36.8 C)] 98.2 F (36.8 C) (03/11 0506) Pulse Rate:  [71-92] 88 (03/11 0506) Resp:  [16-20] 18 (03/11 0506) BP: (103-130)/(41-66) 110/54 mmHg (03/11 0506) SpO2:  [90 %-97 %] 94 % (03/11 0506) Weight:  [317 lb (143.79 kg)] 317 lb (143.79 kg) (03/10 1643) Last BM Date: 01/06/15  Intake/Output from previous day: 03/10 0701 - 03/11 0700 In: 1858.5 [P.O.:596; I.V.:1262.5] Out: 600 [Urine:600] Intake/Output this shift:    PE: Gen:  Alert, NAD, pleasant Abd: Soft, NT/ND, +BS, no HSM   Lab Results:   Recent Labs  01/06/15 0700  WBC 16.7*  HGB 15.4  HCT 44.2  PLT 312   BMET  Recent Labs  01/06/15 0700  NA 139  K 4.2  CL 106  CO2 27  GLUCOSE 118*  BUN 14  CREATININE 0.81  CALCIUM 9.0   PT/INR No results for input(s): LABPROT, INR in the last 72 hours. CMP     Component Value Date/Time   NA 139 01/06/2015 0700   K 4.2 01/06/2015 0700   CL 106 01/06/2015 0700   CO2 27 01/06/2015 0700   GLUCOSE 118* 01/06/2015 0700   BUN 14 01/06/2015 0700   CREATININE 0.81 01/06/2015 0700   CALCIUM 9.0 01/06/2015 0700   PROT 7.1 01/06/2015 0700   ALBUMIN 3.7 01/06/2015 0700   AST 25 01/06/2015 0700   ALT 37 01/06/2015 0700   ALKPHOS 81 01/06/2015 0700   BILITOT 0.4 01/06/2015 0700   GFRNONAA >90 01/06/2015 0700   GFRAA >90 01/06/2015 0700   Lipase     Component Value Date/Time   LIPASE 23 01/06/2015 0700       Studies/Results: Kore[place] Abdomen Limited  01/06/2015   CLINICAL DATA:  Epigastric pain.  EXAM: US ABDOMEN LIMITED - RIGHT UPPER QUADRANT  COMPARISON:  None.  FINDINGS: Gallbladder:  Multiple low ball stones are identified gallbladder wall measures 1.8 mm in thickness. No para cholecystic fluid. Negative sonographic Murphy's  sign.  Common bile duct:  Diameter: 7.5 mm  Liver:  No focal lesion identified. Within normal limits in parenchymal echogenicity.  IMPRESSION: 1. Gallstones.  No secondary signs of acute cholecystitis. 2. Mild increase caliber of the common bile duct.   Electronically Signed   By: Signa Kellaylor  Stroud M.D.   On: 01/06/2015 10:35    Anti-infectives: Anti-infectives    Start     Dose/Rate Route Frequency Ordered Stop   01/06/15 1215  [MAR Hold]  cefTRIAXone (ROCEPHIN) 2 g in dextrose 5 % 50 mL IVPB     (MAR Hold since 01/07/15 1023)  Comments:  Pharmacy may adjust dosing strength / duration / interval for maximal efficacy   2 g 100 mL/hr over 30 Minutes Intravenous Every 24 hours 01/06/15 1214         Assessment/Plan Symptomatic cholelithiasis -For lap chole Today -NPO for OR -IVF -Pain control and anti-emetics -SCD/lovenox -Rocephin  -Urine clear -Discussed post-operative course/management MRSA swab + -discussed use of mupirocin for 7 days BID     LOS: 1 day    Evan Holmes, Evan Holmes 01/07/2015, 10:48 AM Pager: [phone]

## 2015-01-07 NOTE — Anesthesia Postprocedure Evaluation (Signed)
  Anesthesia Post-op Note  Patient: Evan RenshawJeremy B Holmes  Procedure(s) Performed: Procedure(s): LAPAROSCOPIC CHOLECYSTECTOMY WITH INTRAOPERATIVE CHOLANGIOGRAM (N/A)  Patient Location: PACU  Anesthesia Type:General  Level of Consciousness: awake, alert  and oriented  Airway and Oxygen Therapy: Patient Spontanous Breathing and Patient connected to nasal cannula oxygen  Post-op Pain: mild  Post-op Assessment: Post-op Vital signs reviewed, Patient's Cardiovascular Status Stable, Respiratory Function Stable and Patent Airway  Post-op Vital Signs: stable  Last Vitals:  Filed Vitals:   01/07/15 1644  BP: 102/49  Pulse: 62  Temp: 36.5 C  Resp: 15    Complications: No apparent anesthesia complications

## 2015-01-07 NOTE — Care Management (Signed)
An Important Message From Medicare given. Ronny FlurryHeather Claiborne Stroble RN BSN

## 2015-01-08 DIAGNOSIS — K8000 Calculus of gallbladder with acute cholecystitis without obstruction: Secondary | ICD-10-CM

## 2015-01-08 MED ORDER — KETOROLAC TROMETHAMINE 30 MG/ML IJ SOLN
30.0000 mg | Freq: Three times a day (TID) | INTRAMUSCULAR | Status: DC
  Administered 2015-01-08 (×2): 30 mg via INTRAVENOUS
  Filled 2015-01-08 (×2): qty 1

## 2015-01-08 MED ORDER — OXYCODONE HCL 5 MG PO TABS: 5.0000 mg | ORAL_TABLET | ORAL | Status: AC | PRN

## 2015-01-08 MED ORDER — ACETAMINOPHEN 325 MG PO TABS: 650.0000 mg | ORAL_TABLET | Freq: Four times a day (QID) | ORAL | Status: AC | PRN

## 2015-01-08 MED ORDER — MUPIROCIN 2 % EX OINT: 1.0000 "application " | TOPICAL_OINTMENT | Freq: Two times a day (BID) | CUTANEOUS | Status: AC

## 2015-01-08 NOTE — Discharge Summary (Signed)
Physician Discharge Summary  Patient ID: Evan Holmes MRN: [id number] DOB/AGE: 1986-01-01 29 y.o.  Admit date: 01/06/2015 Discharge date: 01/08/2015  Admitting Diagnosis: Symptomatic cholelithiasis  Discharge Diagnosis Patient Active Problem List   Diagnosis Date Noted  . Symptomatic cholelithiasis 01/06/2015  . PAN (polyarteritis nodosa)   . Uveitis   . Seizure disorder   Calculus of gallbladder with acute cholecystitis, without mention of obstruction  Consultants none  Imaging: Dg Cholangiogram Operative  01/07/2015   CLINICAL DATA:  29 year old male with a history of cholelithiasis.  EXAM: INTRAOPERATIVE CHOLANGIOGRAM  COMPARISON:  Ultrasound 01/06/2015  FINDINGS: Fluoroscopic images during intraoperative cholangiogram.  Cannulation of the cystic duct, with surgical clips in the region of the cystic duct.  Rounded density at the inferior liver hilum, may be within the gallbladder lumen before complete removal, or may be dense material within the gallbladder fossa.  There is antegrade flow of contrast within the cystic duct, and partial opacification of the common hepatic duct, common bile duct, and the intrahepatic ducts. Ducts within normal limits. No evidence of narrowing/stricture. No filling defects.  Contrast traverses the ampulla entering the duodenum.  No extraluminal contrast.  IMPRESSION: Intraoperative cholangiogram demonstrates normal caliber of the extrahepatic biliary ducts, with no filling defects and with normal transit of the contrast across the ampulla.  Please refer to the dictated operative report for full details of intraoperative findings and procedure.  Signed,  Yvone Neu. Loreta Ave, DO  [facility]  [facility]   Electronically Signed   By: Gilmer Mor D.O.   On: 01/07/2015 14:49   US Abdomen Limited  01/06/2015   CLINICAL DATA:  Epigastric pain.  EXAM: US ABDOMEN LIMITED - RIGHT UPPER QUADRANT  COMPARISON:  None.   FINDINGS: Gallbladder:  Multiple low ball stones are identified gallbladder wall measures 1.8 mm in thickness. No para cholecystic fluid. Negative sonographic Murphy's sign.  Common bile duct:  Diameter: 7.5 mm  Liver:  No focal lesion identified. Within normal limits in parenchymal echogenicity.  IMPRESSION: 1. Gallstones.  No secondary signs of acute cholecystitis. 2. Mild increase caliber of the common bile duct.   Electronically Signed   By: Signa Kell M.D.   On: 01/06/2015 10:35    Procedures Laparoscopic cholecystectomy with ioc---Dr. 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 He presented today with worsening epigastric abdominal pain. His work up showed, [place] with cholelithiasis, white count of 16k, normal LFTs.  Patient was admitted and underwent procedure listed above.  Tolerated procedure well and was transferred to the floor.  Diet was advanced as tolerated.  On POD#1, the patient was voiding well, tolerating diet, ambulating well, pain well controlled, vital signs stable, incisions c/d/i and felt stable for discharge home. Medication risks, benefits and therapeutic alternatives were reviewed with the patient.  H verbalizes understanding.    Patient will follow up in our office in 2 weeks and knows to call with questions or concerns.  Physical Exam: General:  Alert, NAD, pleasant, comfortable Abd:  Soft, ND, mild tenderness, incisions C/D/I    Medication List    TAKE these medications        acetaminophen 325 MG tablet  Commonly known as:  TYLENOL  Take 2 tablets (650 mg total) by mouth every 6 (six) hours as needed  for mild pain (or Temp > 100).     b complex vitamins tablet  Take 1 tablet by mouth daily.     furosemide 40 MG tablet  Commonly known as:  LASIX  Take 40 mg by mouth daily.      HYDROcodone-acetaminophen 5-325 MG per tablet  Commonly known as:  NORCO/VICODIN  Take 1 tablet by mouth every 6 (six) hours as needed for moderate pain.     indomethacin 50 MG capsule  Commonly known as:  INDOCIN  Take 50 mg by mouth 2 (two) times daily with a meal.     multivitamin tablet  Take 1 tablet by mouth daily.     omeprazole 40 MG capsule  Commonly known as:  PRILOSEC  Take 40 mg by mouth daily.     ondansetron 8 MG tablet  Commonly known as:  ZOFRAN  Take by mouth every 8 (eight) hours as needed for nausea or vomiting.     oxyCODONE 5 MG immediate release tablet  Commonly known as:  Oxy IR/ROXICODONE  Take 1-2 tablets (5-10 mg total) by mouth every 4 (four) hours as needed for moderate pain.     predniSONE 10 MG tablet  Commonly known as:  DELTASONE  Take 10 mg by mouth daily as needed (when immunity affects eyes). As directed for auto immune     promethazine 25 MG tablet  Commonly known as:  PHENERGAN  Take 25 mg by mouth every 6 (six) hours as needed for nausea or vomiting.     traZODone 50 MG tablet  Commonly known as:  DESYREL  50 mg. 3 tab po QHS             Follow-up Information    Follow up with [facility] [place] On 01/25/2015.   Why:  For post-operation check. Your appointment is at 2:00pm, please arrive at least 30 min before your appointment to complete your check in paperwork.  If you are unable to arrive 30 min prior to your appointment time we may have to cancel or reschedule you   Contact information:   [address] [place] [phone]      Signed: Ashok Norrismina Eilan Mcinerny, [facility] Orthopaedic InstituteNP-BC [facility] [phone]  01/08/2015, 8:20 AM

## 2015-01-08 NOTE — Progress Notes (Signed)
Pt for discharge to home accomp by mom.  DC instructions given and explained to mom and pt.  Rx for Oxy given and explained.  Reviewed wound care, FU visit 3/29 at 2:00.  Instructed pt/mom on what to call MD for.  Pt understands pain control.  No further questions verbalized about home self care.

## 2015-01-09 ENCOUNTER — Encounter (HOSPITAL_COMMUNITY): Payer: Self-pay | Admitting: Surgery

## 2017-06-26 ENCOUNTER — Emergency Department (HOSPITAL_COMMUNITY)
Admission: EM | Admit: 2017-06-26 | Discharge: 2017-06-27 | Disposition: A | Discharge status: Home / Self Care | Payer: 59 | Attending: Emergency Medicine | Admitting: Emergency Medicine

## 2017-06-26 ENCOUNTER — Encounter (HOSPITAL_COMMUNITY): Payer: Self-pay

## 2017-06-26 DIAGNOSIS — M545 Low back pain: Secondary | ICD-10-CM

## 2017-06-26 DIAGNOSIS — Z79899 Other long term (current) drug therapy: Secondary | ICD-10-CM | POA: Insufficient documentation

## 2017-06-26 DIAGNOSIS — G8929 Other chronic pain: Secondary | ICD-10-CM | POA: Diagnosis not present

## 2017-06-26 DIAGNOSIS — F17210 Nicotine dependence, cigarettes, uncomplicated: Secondary | ICD-10-CM | POA: Insufficient documentation

## 2017-06-26 MED ORDER — KETOROLAC TROMETHAMINE 15 MG/ML IJ SOLN
15.0000 mg | Freq: Once | INTRAMUSCULAR | Status: AC
  Administered 2017-06-26: 15 mg via INTRAMUSCULAR
  Filled 2017-06-26: qty 1

## 2017-06-26 MED ORDER — OXYCODONE-ACETAMINOPHEN 5-325 MG PO TABS
1.0000 | ORAL_TABLET | Freq: Once | ORAL | Status: AC
  Administered 2017-06-26: 1 via ORAL
  Filled 2017-06-26: qty 1

## 2017-06-26 NOTE — ED Triage Notes (Signed)
Patient complains of lower back pain x 1 week after helping mow yard. Reports that his legs gave way due to weakness and the pain. Using cane this past week. Alert and oriented, MAE X 4

## 2017-06-26 NOTE — ED Provider Notes (Signed)
[facility]-EMERGENCY DEPT Provider Note   CSN: [id number] Arrival date & time: 06/26/17  1625     History   Chief Complaint Chief Complaint  Patient presents with  . Back Pain    HPI  Evan Holmes is a 31 year old male with a history of autoimmune vasculitis and chronic back pain, who presents with acutely worsened low back pain for the past 5 days. Patient reports last Friday he tried to mow the lawn and afterwards his back pain was acutely worse. He reports the pain radiates down right leg sometimes. He has tried resting as well as ibuprofen and has seen no improvement in the back pain. Today he was in so much pain that he was having trouble standing up straight to walk. Denies weakness, numbness, loss of bowel/bladder function or saddle anesthesia. Patient does not currently take chronic pain medication for back pain. Patient is accompanied by mother who expresses concern about sunburn he got on Friday because it blistered and he doesn't usually get sunburn at all. Patient denies fevers, chills, dysuria, abdominal pain. Patient is on chronic steroid therapy.  Patient is followed by Dr. Frederik Schmidt at [facility] for his autoimmune disease.      Past Medical History:  Diagnosis Date  . Cholelithiasis   . Headache   . PAN (polyarteritis nodosa) (HCC)   . Seizure disorder (HCC)   . Uveitis     Patient Active Problem List   Diagnosis Date Noted  . Calculus of gallbladder with acute cholecystitis 01/08/2015  . Symptomatic cholelithiasis 01/06/2015  . PAN (polyarteritis nodosa) (HCC)   . Uveitis   . Seizure disorder [facility])     Past Surgical History:  Procedure Laterality Date  . BIOPSY EYE MUSCLE    . bone grafts    . CHOLECYSTECTOMY N/A 01/07/2015   Procedure: LAPAROSCOPIC CHOLECYSTECTOMY WITH INTRAOPERATIVE CHOLANGIOGRAM;  Surgeon: Manus Rudd, MD;  Location: [facility] OR;  Service: General;  Laterality: N/A;  . KNEE SURGERY         Home Medications    Prior to Admission medications    Medication Sig Start Date End Date Taking? Authorizing Provider  acetaminophen (TYLENOL) 325 MG tablet Take 2 tablets (650 mg total) by mouth every 6 (six) hours as needed for mild pain (or Temp > 100). 01/08/15   Riebock, Emina, NP  b complex vitamins tablet Take 1 tablet by mouth daily.    [provider]  furosemide (LASIX) 40 MG tablet Take 40 mg by mouth daily.    [provider]  HYDROcodone-acetaminophen (NORCO/VICODIN) 5-325 MG per tablet Take 1 tablet by mouth every 6 (six) hours as needed for moderate pain.    [provider]  indomethacin (INDOCIN) 50 MG capsule Take 50 mg by mouth 2 (two) times daily with a meal.    [provider]  Multiple Vitamin (MULTIVITAMIN) tablet Take 1 tablet by mouth daily.    [provider]  omeprazole (PRILOSEC) 40 MG capsule Take 40 mg by mouth daily.    [provider]  ondansetron (ZOFRAN) 8 MG tablet Take by mouth every 8 (eight) hours as needed for nausea or vomiting.    [provider]  oxyCODONE (OXY IR/ROXICODONE) 5 MG immediate release tablet Take 1-2 tablets (5-10 mg total) by mouth every 4 (four) hours as needed for moderate pain. Patient not taking: Reported on 06/26/2017 01/08/15   Ashok Norris, NP  predniSONE (DELTASONE) 10 MG tablet Take 10 mg by mouth daily as needed (when immunity affects eyes). As  directed for auto immune    [provider]  promethazine (PHENERGAN) 25 MG tablet Take 25 mg by mouth every 6 (six) hours as needed for nausea or vomiting.    [provider]  traZODone (DESYREL) 50 MG tablet 50 mg. 3 tab po QHS    [provider]    Family History Family History  Problem Relation Age of Onset  . COPD Father   . Hyperlipidemia Father   . Diabetes Father     Social History Social History  Substance Use Topics  . Smoking status: Current Every Day Smoker    Packs/day: 1.00    Years: 10.00    Types: Cigarettes  . Smokeless  tobacco: Never Used  . Alcohol use Yes     Comment: occasional     Allergies   Shellfish allergy   Review of Systems Review of Systems  Constitutional: Negative for chills and fatigue.  HENT: Negative for ear pain, rhinorrhea and sore throat.   Eyes: Negative for photophobia and visual disturbance.  Respiratory: Negative for cough, chest tightness and shortness of breath.   Cardiovascular: Negative for chest pain and palpitations.  Gastrointestinal: Negative for abdominal pain, nausea and vomiting.  Genitourinary: Negative for difficulty urinating and dysuria.  Musculoskeletal: Positive for back pain.  Skin: Positive for color change. Negative for rash.       Sunburn to shoulders and arms  Neurological: Positive for headaches. Negative for dizziness, facial asymmetry, weakness, light-headedness and numbness.     Physical Exam Updated Vital Signs BP (!) 160/77 (BP Location: Right Arm)   Pulse 93   Temp 98.3 F (36.8 C) (Oral)   Resp 18   SpO2 99%   Physical Exam  Constitutional: He appears well-developed and well-nourished. No distress.  HENT:  Head: Normocephalic and atraumatic.  Eyes: Pupils are equal, round, and reactive to light. EOM are normal. Right eye exhibits no discharge. Left eye exhibits no discharge.  Cardiovascular: Normal rate, regular rhythm, normal heart sounds and intact distal pulses.   Pulmonary/Chest: Effort normal and breath sounds normal. No respiratory distress.  Abdominal: Soft. Bowel sounds are normal. There is no tenderness. There is no guarding.  Musculoskeletal: He exhibits no edema or deformity.  Tender to palpation across the low back, pain with range of motion of the lumbar spine  Neurological: He is alert. Coordination normal.  Speech is clear, able to follow commands CN III-XII intact Normal strength in upper and lower extremities bilaterally including dorsiflexion and plantar flexion, strong and equal grip strength Sensation normal to  light and sharp touch Moves extremities without ataxia, coordination intact  Skin: Skin is warm and dry. Capillary refill takes less than 2 seconds. He is not diaphoretic.  Sunburn present on bilateral shoulders and upper extremities with some unroofed blisters  Psychiatric: He has a normal mood and affect. His behavior is normal.  Nursing note and vitals reviewed.    ED Treatments / Results  Labs (all labs ordered are listed, but only abnormal results are displayed) Labs Reviewed - No data to display  EKG  EKG Interpretation None       Radiology No results found.  Procedures Procedures (including critical care time)  Medications Ordered in ED Medications  cyclobenzaprine (FLEXERIL) tablet 5 mg (not administered)  ketorolac (TORADOL) 15 MG/ML injection 15 mg (15 mg Intramuscular Given 06/26/17 2330)  oxyCODONE-acetaminophen (PERCOCET/ROXICET) 5-325 MG per tablet 1 tablet (1 tablet Oral Given 06/26/17 2330)     Initial Impression / Assessment  and Plan / ED Course  I have reviewed the triage vital signs and the nursing notes.  Pertinent labs & imaging results that were available during my care of the patient were reviewed by me and considered in my medical decision making (see chart for details).  Back pain is likely musculoskeletal or disc related. No weakness numbness or loss of bowel or bowel or bladder control, unconcerned for cauda equina syndrome. Will treat patient's pain and then reassess patient's mobility. Will obtain lumbar XR for reassurance.  If pain is improved and XR is normal will discharge home with Flexeril and ibuprofen for pain management. Patient to follow up with [facility] pain clinic.  Patient discussed with Dr. Jeraldine Loots, who saw patient as well and agrees with plan.  At shift change care was transferred to [facility] who will follow pending x-ray, re-evaulate and determine disposition.     Final Clinical Impressions(s) / ED Diagnoses    Final diagnoses:  Low back pain, unspecified back pain laterality, unspecified chronicity, with sciatica presence unspecified    New Prescriptions New Prescriptions   No medications on file         Legrand Rams 06/27/17 0115    Gerhard Munch, MD 07/02/17 [phone]

## 2017-06-27 ENCOUNTER — Emergency Department (HOSPITAL_COMMUNITY): Payer: 59

## 2017-06-27 MED ORDER — OXYCODONE HCL 5 MG PO TABS
10.0000 mg | ORAL_TABLET | Freq: Once | ORAL | Status: AC
  Administered 2017-06-27: 10 mg via ORAL
  Filled 2017-06-27: qty 2

## 2017-06-27 MED ORDER — METHOCARBAMOL 500 MG PO TABS: 500.0000 mg | ORAL_TABLET | Freq: Two times a day (BID) | ORAL | 0 refills | Status: AC

## 2017-06-27 MED ORDER — CYCLOBENZAPRINE HCL 10 MG PO TABS
5.0000 mg | ORAL_TABLET | Freq: Once | ORAL | Status: AC
Start: 2017-06-27 — End: 2017-06-27
  Administered 2017-06-27: 5 mg via ORAL
  Filled 2017-06-27: qty 1

## 2017-06-27 MED ORDER — DIAZEPAM 5 MG PO TABS
10.0000 mg | ORAL_TABLET | Freq: Once | ORAL | Status: AC
  Administered 2017-06-27: 10 mg via ORAL
  Filled 2017-06-27: qty 2

## 2017-06-27 NOTE — ED Notes (Signed)
Pt departed in NAD, escorted to front waiting by this RN.

## 2017-06-27 NOTE — ED Notes (Signed)
Patient transported to X-ray 

## 2017-06-27 NOTE — ED Provider Notes (Signed)
Care assumed from [facility] Hospital[facility] Holmes, [place]PA-C.  Evan Holmes is a 31 y.o. male presents with a history of autoimmune vasculitis and chronic back pain presents with acutely worsening back pain for last 5 days. Patient reports he's had difficulty walking at home due to severe pain and has been using a walker to assist himself. He denies loss of bowel or bladder control, leg weakness, numbness or tingling.  Patient was evaluated by Evan Holmes who was in agreement with the plan for discharge home after plain films.  Physical Exam  BP 105/68   Pulse 72   Temp 98.3 F (36.8 C) (Oral)   Resp 16   SpO2 95%   Physical Exam  Constitutional: He appears well-developed and well-nourished. No distress.  HENT:  Head: Normocephalic.  Eyes: Conjunctivae are normal. No scleral icterus.  Neck: Normal range of motion.  Cardiovascular: Normal rate and intact distal pulses.   Pulmonary/Chest: Effort normal.  Musculoskeletal: Normal range of motion.  Neurological: He is alert.  Strength 5/5 in the bilateral lower extremities Antalgic gait after additional medications  Skin: Skin is warm and dry.  Nursing note and vitals reviewed.   Dg Lumbar Spine Complete  Result Date: 06/27/2017 CLINICAL DATA:  Low back pain for 1 week EXAM: LUMBAR SPINE - COMPLETE 4+ VIEW COMPARISON:  None. FINDINGS: There is no evidence of lumbar spine fracture. Alignment is normal. Intervertebral disc spaces are maintained. IMPRESSION: Negative. Electronically Signed   By: Jasmine PangKim  Fujinaga M.D.   On: 06/27/2017 01:12    ED Course  Procedures Clinical Course as of Jun 27 309  Thu Jun 27, 2017  0100 Plan: plain films pending.  If neg, pt may be d/c home to follow up with [facility]  [HM]    Clinical Course User Index [HM] Caryl Manas, Boyd KerbsHannah, PA-C    MDM Patient with persistent low back pain. He was given additional oxycodone and Valium here in the emergency department but was able to ambulate after this. Plain films  without acute abnormality. Discussed the importance of follow-up with [facility] and his rheumatologist. Patient and mother state understanding and agreement with the plan.  Low back pain, unspecified back pain laterality, unspecified chronicity, with sciatica presence unspecified       Milta DeitersMuthersbaugh, Dany Walther, PA-C 06/27/17 [id number]    Ward, Layla MawKristen N, DO 06/27/17 [phone]

## 2017-06-27 NOTE — Discharge Instructions (Addendum)
Take Robaxin and Ibuprofen as needed for pain management. Your x-ray shows no acute fractures. Follow up with [facility]. Return to the ED if you develop weakness or numbness of both legs, are unable to move your legs or cannot control your bowels or bladder.

## 2017-07-04 ENCOUNTER — Other Ambulatory Visit: Payer: Self-pay | Admitting: Internal Medicine

## 2017-07-04 DIAGNOSIS — M5441 Lumbago with sciatica, right side: Secondary | ICD-10-CM

## 2017-07-13 ENCOUNTER — Other Ambulatory Visit: Payer: Medicare Other

## 2017-07-21 ENCOUNTER — Ambulatory Visit
Admission: RE | Admit: 2017-07-21 | Discharge: 2017-07-21 | Disposition: A | Discharge status: Home / Self Care | Payer: 59 | Source: Ambulatory Visit | Attending: Internal Medicine | Admitting: Internal Medicine

## 2017-07-21 DIAGNOSIS — M5441 Lumbago with sciatica, right side: Secondary | ICD-10-CM

## 2017-07-21 MED ORDER — GADOBENATE DIMEGLUMINE 529 MG/ML IV SOLN: 20.0000 mL | Freq: Once | INTRAVENOUS | Status: DC | PRN
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
## 2019-02-10 ENCOUNTER — Ambulatory Visit: Payer: Medicare Other | Admitting: Internal Medicine

## 2019-05-07 ENCOUNTER — Ambulatory Visit: Payer: 59 | Admitting: Internal Medicine

## 2019-05-07 DIAGNOSIS — Z0289 Encounter for other administrative examinations: Secondary | ICD-10-CM

## 2023-11-29 ENCOUNTER — Ambulatory Visit: Payer: 59 | Admitting: Nurse Practitioner

## 2024-01-03 ENCOUNTER — Ambulatory Visit: Payer: 59 | Admitting: Nurse Practitioner

## 2024-03-02 ENCOUNTER — Telehealth: Payer: Self-pay

## 2024-03-02 NOTE — Telephone Encounter (Signed)
 Copied from CRM [id number]. Topic: General - Other >> Mar 02, 2024  9:33 AM Evan Holmes S wrote: Reason for CRM: Pt would like for [name] to cb regarding appt- due to this one is too far out-

## 2024-05-18 ENCOUNTER — Ambulatory Visit: Admitting: Family Medicine
